# Patient Record
Sex: Female | Born: 1980 | Race: Black or African American | Hispanic: No | Marital: Single | Smoking: Never smoker
Health system: Southern US, Community
[De-identification: ages and names within clinical notes are randomized; demographics above are authoritative.]

---

## 2009-12-05 ENCOUNTER — Emergency Department (HOSPITAL_COMMUNITY): Admission: EM | Admit: 2009-12-05 | Discharge: 2009-12-05 | Payer: Self-pay | Admitting: Emergency Medicine

## 2015-09-18 ENCOUNTER — Encounter (HOSPITAL_BASED_OUTPATIENT_CLINIC_OR_DEPARTMENT_OTHER): Payer: Self-pay

## 2015-09-18 ENCOUNTER — Emergency Department (HOSPITAL_BASED_OUTPATIENT_CLINIC_OR_DEPARTMENT_OTHER)
Admission: EM | Admit: 2015-09-18 | Discharge: 2015-09-18 | Disposition: A | Discharge status: Home / Self Care | Payer: Self-pay | Attending: Emergency Medicine | Admitting: Emergency Medicine

## 2015-09-18 ENCOUNTER — Emergency Department (HOSPITAL_BASED_OUTPATIENT_CLINIC_OR_DEPARTMENT_OTHER): Payer: Self-pay

## 2015-09-18 DIAGNOSIS — R079 Chest pain, unspecified: Secondary | ICD-10-CM

## 2015-09-18 DIAGNOSIS — R0789 Other chest pain: Secondary | ICD-10-CM | POA: Insufficient documentation

## 2015-09-18 LAB — CBC WITH DIFFERENTIAL/PLATELET
BASOS PCT: 0 %
Basophils Absolute: 0 10*3/uL (ref 0.0–0.1)
EOS ABS: 0.1 10*3/uL (ref 0.0–0.7)
Eosinophils Relative: 2 %
HEMATOCRIT: 34.1 % — AB (ref 36.0–46.0)
HEMOGLOBIN: 11 g/dL — AB (ref 12.0–15.0)
LYMPHS ABS: 1.7 10*3/uL (ref 0.7–4.0)
Lymphocytes Relative: 29 %
MCH: 29.1 pg (ref 26.0–34.0)
MCHC: 32.3 g/dL (ref 30.0–36.0)
MCV: 90.2 fL (ref 78.0–100.0)
MONOS PCT: 13 %
Monocytes Absolute: 0.8 10*3/uL (ref 0.1–1.0)
NEUTROS ABS: 3.3 10*3/uL (ref 1.7–7.7)
NEUTROS PCT: 56 %
Platelets: 232 10*3/uL (ref 150–400)
RBC: 3.78 MIL/uL — AB (ref 3.87–5.11)
RDW: 14.9 % (ref 11.5–15.5)
WBC: 5.9 10*3/uL (ref 4.0–10.5)

## 2015-09-18 LAB — COMPREHENSIVE METABOLIC PANEL
ALBUMIN: 3.7 g/dL (ref 3.5–5.0)
ALK PHOS: 46 U/L (ref 38–126)
ALT: 16 U/L (ref 14–54)
AST: 23 U/L (ref 15–41)
Anion gap: 6 (ref 5–15)
BILIRUBIN TOTAL: 0.6 mg/dL (ref 0.3–1.2)
BUN: 19 mg/dL (ref 6–20)
CALCIUM: 8.5 mg/dL — AB (ref 8.9–10.3)
CO2: 26 mmol/L (ref 22–32)
CREATININE: 0.87 mg/dL (ref 0.44–1.00)
Chloride: 105 mmol/L (ref 101–111)
GFR calc Af Amer: >60 mL/min (ref >60)
GFR calc non Af Amer: >60 mL/min (ref >60)
GLUCOSE: 92 mg/dL (ref 65–99)
Potassium: 3.3 mmol/L — ABNORMAL LOW (ref 3.5–5.1)
SODIUM: 137 mmol/L (ref 135–145)
TOTAL PROTEIN: 7.3 g/dL (ref 6.5–8.1)

## 2015-09-18 LAB — TROPONIN I: Troponin I: <0.03 ng/mL (ref <0.031)

## 2015-09-18 LAB — D-DIMER, QUANTITATIVE: D-Dimer, Quant: 0.33 ug/mL-FEU (ref 0.00–0.50)

## 2015-09-18 MED ORDER — NAPROXEN 500 MG PO TABS: 500.0000 mg | ORAL_TABLET | Freq: Two times a day (BID) | ORAL | Status: AC

## 2015-09-18 NOTE — Discharge Instructions (Signed)
Chest Wall Pain °Chest wall pain is pain in or around the bones and muscles of your chest. Sometimes, an injury causes this pain. Sometimes, the cause may not be known. This pain may take several weeks or longer to get better. °HOME CARE °Pay attention to any changes in your symptoms. Take these actions to help with your pain: °· Rest as told by your doctor. °· Avoid activities that cause pain. Try not to use your chest, belly (abdominal), or side muscles to lift heavy things. °· If directed, apply ice to the painful area: °¨ Put ice in a plastic bag. °¨ Place a towel between your skin and the bag. °¨ Leave the ice on for 20 minutes, 2-3 times per day. °· Take over-the-counter and prescription medicines only as told by your doctor. °· Do not use tobacco products, including cigarettes, chewing tobacco, and e-cigarettes. If you need help quitting, ask your doctor. °· Keep all follow-up visits as told by your doctor. This is important. °GET HELP IF: °· You have a fever. °· Your chest pain gets worse. °· You have new symptoms. °GET HELP RIGHT AWAY IF: °· You feel sick to your stomach (nauseous) or you throw up (vomit). °· You feel sweaty or light-headed. °· You have a cough with phlegm (sputum) or you cough up blood. °· You are short of breath. °  °This information is not intended to replace advice given to you by your health care provider. Make sure you discuss any questions you have with your health care provider. °  °Document Released: 09/11/2007 Document Revised: 12/14/2014 Document Reviewed: 06/20/2014 °Elsevier Interactive Patient Education ©2016 [employer] ° °

## 2015-09-18 NOTE — ED Notes (Signed)
CP x today-NAD-steady gait 

## 2015-09-18 NOTE — ED Provider Notes (Addendum)
CSN: [id number]     Arrival date & time 09/18/15  1628 History  By signing my name below, I, Kathryn Leon, attest that this documentation has been prepared under the direction and in the presence of Alvira MondayErin Ryenne Lynam, MD. Electronically Signed: Soijett Leon, ED Scribe. 09/18/2015. 5:01 PM.   Chief Complaint  Patient presents with  . Chest Pain      The history is provided by the patient. No language interpreter was used.    HPI Comments: Verl Dickerakesha Asmus is a 35 y.o. female who presents to the Emergency Department complaining of 7/10, constant, sharp, radiating, sternal CP onset early this morning. Pt states that her sternal CP radiates to her right sided neck. Pt CP is worsened with deep breathing, movement, and she denies any alleviating factors. She states that she has tried ASA for the relief for her symptoms. She denies SOB, nausea, abdominal pain, leg pain, cough, fever, numbness, weakness, back pain, and any other symptoms. Denies any medical issues. Pt denies immobilization, surgery, estrogen use, birth control use, or PMHx of blood clots. Pt notes that she went to [place] this weekend and she got out of the car during the trip. Pt states that her father had a MI in late 7450s. Denies HTN or DM. Denies pregnancy. Lifts at work.    History reviewed. No pertinent past medical history. Past Surgical History  Procedure Laterality Date  . Cesarean section     No family history on file. Social History  Substance Use Topics  . Smoking status: Never Smoker   . Smokeless tobacco: None  . Alcohol Use: Yes     Comment: occ   OB History    No data available     Review of Systems  Constitutional: Negative for fever.  HENT: Negative for sore throat.   Eyes: Negative for visual disturbance.  Respiratory: Positive for shortness of breath (feels like can't get a breath). Negative for cough.   Cardiovascular: Positive for chest pain.  Gastrointestinal: Negative for nausea, vomiting and  abdominal pain.  Genitourinary: Negative for difficulty urinating.  Musculoskeletal: Positive for neck pain (right sided). Negative for back pain and arthralgias.  Skin: Negative for rash.  Neurological: Negative for syncope, weakness, numbness and headaches.      Allergies  Review of patient's allergies indicates no known allergies.  Home Medications   Prior to Admission medications   Medication Sig Start Date End Date Taking? Authorizing Provider  naproxen (NAPROSYN) 500 MG tablet Take 1 tablet (500 mg total) by mouth 2 (two) times daily. 09/18/15   Alvira MondayErin Purnell Daigle, MD   BP 145/88 mmHg  Pulse 74  Temp(Src) 98.8 F (37.1 C) (Oral)  Resp 23  Ht 6' (1.829 m)  Wt 165 lb (74.844 kg)  BMI 22.37 kg/m2  SpO2 100%  LMP 08/27/2015 Physical Exam  Constitutional: She is oriented to person, place, and time. She appears well-developed and well-nourished. No distress.  HENT:  Head: Normocephalic and atraumatic.  Eyes: EOM are normal.  Neck: Neck supple.  Cardiovascular: Normal rate, regular rhythm and normal heart sounds.  Exam reveals no gallop and no friction rub.   No murmur heard. Pulmonary/Chest: Effort normal and breath sounds normal. No respiratory distress. She has no wheezes. She has no rales. She exhibits tenderness.  Abdominal: Soft. She exhibits no distension. There is no tenderness.  Musculoskeletal: Normal range of motion.  Neurological: She is alert and oriented to person, place, and time.  Skin: Skin is warm and dry.  Psychiatric:  She has a normal mood and affect. Her behavior is normal.  Nursing note and vitals reviewed.   ED Course  Procedures (including critical care time) DIAGNOSTIC STUDIES: Oxygen Saturation is 100% on RA, nl by my interpretation.    COORDINATION OF CARE: 5:01 PM Discussed treatment plan with pt at bedside which includes labs, CXR, EKG, and pt agreed to plan.    Labs Review Labs Reviewed  CBC WITH DIFFERENTIAL/PLATELET - Abnormal;  Notable for the following:    RBC 3.78 (*)    Hemoglobin 11.0 (*)    HCT 34.1 (*)    All other components within normal limits  COMPREHENSIVE METABOLIC PANEL - Abnormal; Notable for the following:    Potassium 3.3 (*)    Calcium 8.5 (*)    All other components within normal limits  TROPONIN I  D-DIMER, QUANTITATIVE (NOT AT [facility])    Imaging Review Dg Chest 2 View  09/18/2015  CLINICAL DATA:  Mid chest pain, shortness of breath, difficulty breathing today EXAM: CHEST  2 VIEW COMPARISON:  None FINDINGS: Borderline enlargement of cardiac silhouette. Mediastinal contours and pulmonary vascularity normal. Lungs clear. No pleural effusion or pneumothorax. Bones unremarkable. IMPRESSION: No acute abnormalities. Electronically Signed   By: Ulyses Southward M.D.   On: 09/18/2015 17:30   I have personally reviewed and evaluated these images and lab results as part of my medical decision-making.   EKG Interpretation   Date/Time:  Monday September 18 2015 16:42:20 EDT Ventricular Rate:  72 PR Interval:  128 QRS Duration: 98 QT Interval:  394 QTC Calculation: 431 R Axis:   75 Text Interpretation:  Sinus rhythm Probable left atrial enlargement  Unusual p axis has now resolved Confirmed by [facility] MD, Jaryan Chicoine ([id number])  on 09/18/2015 6:03:02 PM      MDM   Final diagnoses:  Chest pain, unspecified chest pain type  Chest wall pain   35 year old female with no static and medical history presents with concern for chest pain beginning this morning. Patient is low risk Wells with a negative d-dimer, and have low suspicion for PE. Initial EKG showed possible lead reversal versus ectopic atrial rhythm. These were done within a short period of time, and unclear whether patient did have an ectopic atrial rhythm or whether this was actual lead reversal, however second EKG showed normal sinus rhythm without other acute ST changes. Patient is low risk hear score, and had single negative troponin, and more than 6  hours after any onset of constant chest pain, no low suspicion for ACS. History and physical exam are not consistent with dissection. Chest x-ray without acute abnormalities. Patient with chest wall tenderness, as well as right trapezius tenderness, consistent with likely musculoskeletal pain. Recommend close primary care physician follow-up within the next week. Provided number for contact me health and wellness. Gave prescription for naproxen, and recommended ice and heat. Patient discharged in stable condition with understanding of reasons to return.   I personally performed the services described in this documentation, which was scribed in my presence. The recorded information has been reviewed and is accurate.    Alvira Monday, MD 09/18/15 [id number]  Alvira Monday, MD 09/18/15 [id number]

## 2016-12-13 IMAGING — DX DG CHEST 2V
2 series · 2 of 2 positions shown · non-contrast
Comparison: None

CLINICAL DATA: Mid chest pain, shortness of breath, difficulty
breathing today

EXAM:
CHEST  2 VIEW

[chest pa]
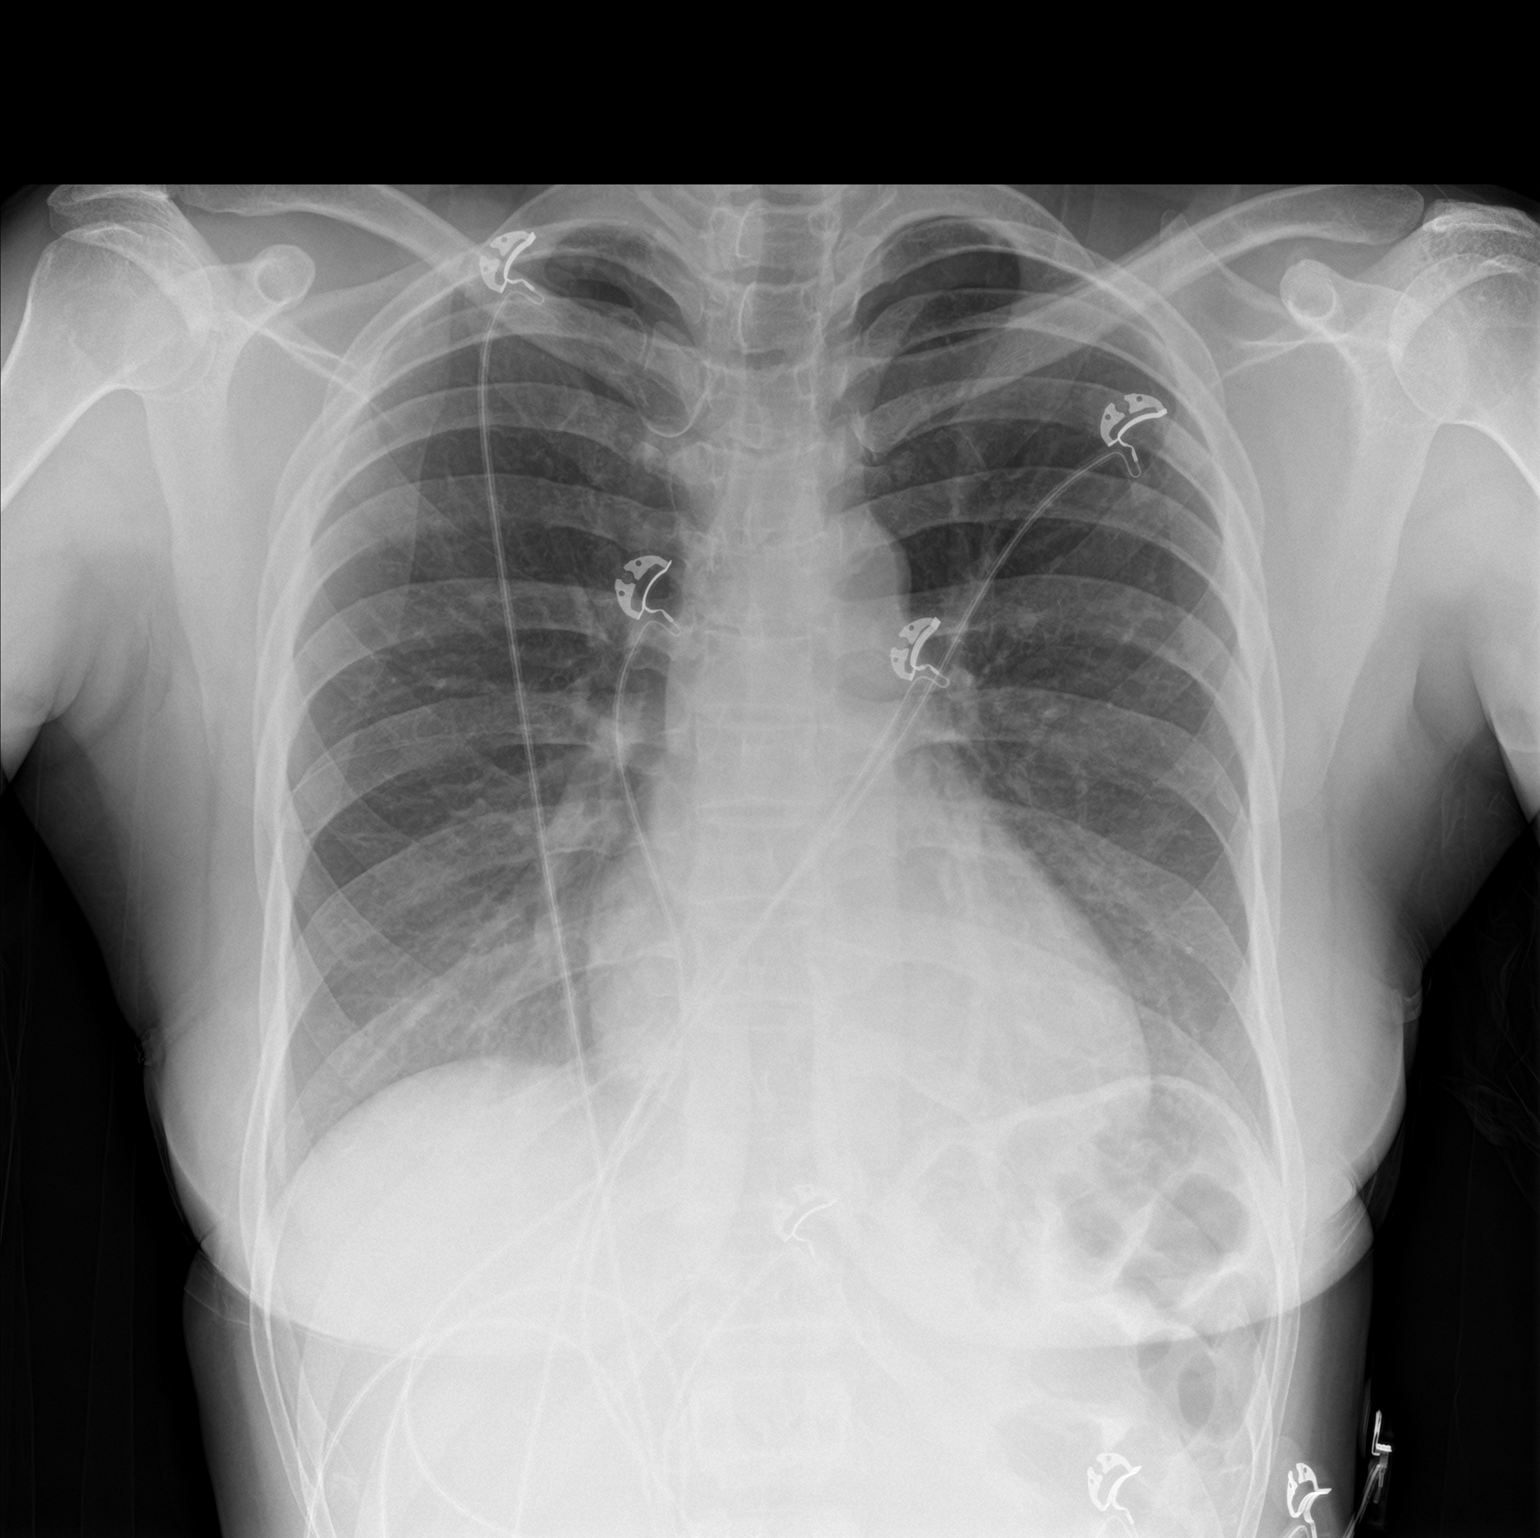

[chest lat]
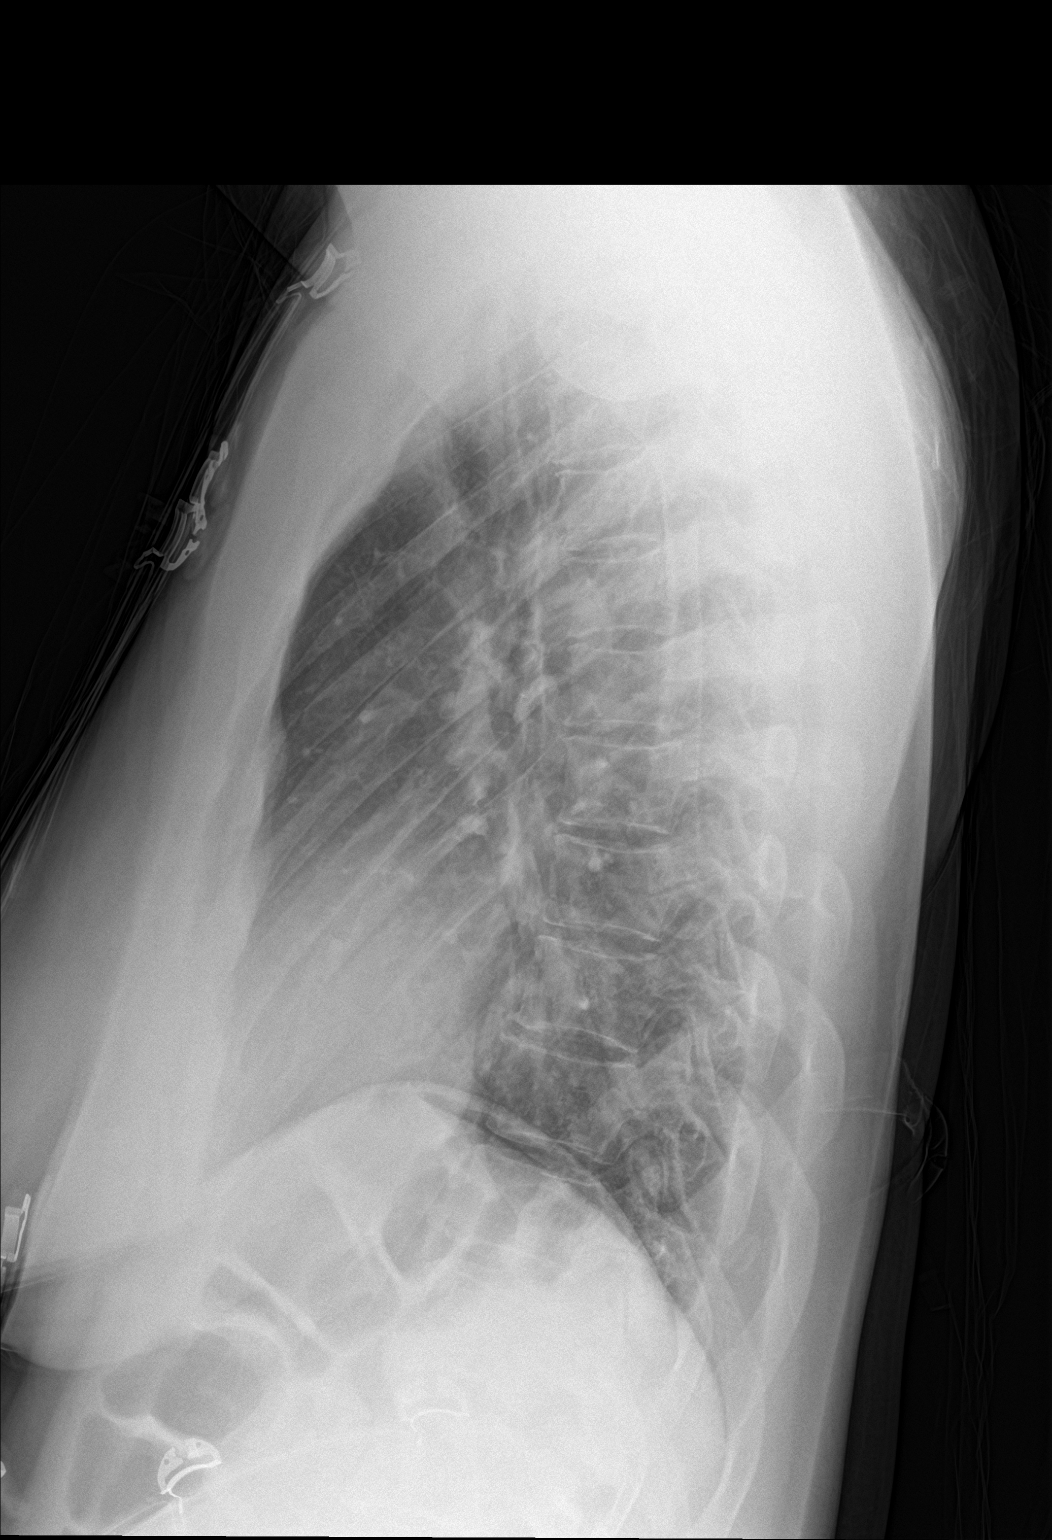

[2 of 2 positions shown; findings below may reference images not displayed]

FINDINGS: Borderline enlargement of cardiac silhouette.

Mediastinal contours and pulmonary vascularity normal.

Lungs clear.

No pleural effusion or pneumothorax.

Bones unremarkable.
IMPRESSION: No acute abnormalities.
